# Patient Record
Sex: Female | Born: 1975 | Race: White | Hispanic: No | Marital: Married | Smoking: Never smoker
Health system: Southern US, Community
[De-identification: ages and names within clinical notes are randomized; demographics above are authoritative.]

## PROBLEM LIST (undated history)

## (undated) DIAGNOSIS — F419 Anxiety disorder, unspecified: Secondary | ICD-10-CM

## (undated) HISTORY — PX: NO PAST SURGERIES: SHX2092

---

## 2016-02-09 LAB — HM HEPATITIS C SCREENING LAB: HM Hepatitis Screen: NEGATIVE

## 2019-12-03 ENCOUNTER — Other Ambulatory Visit: Payer: Self-pay

## 2019-12-03 ENCOUNTER — Other Ambulatory Visit: Payer: Self-pay | Admitting: *Deleted

## 2019-12-03 DIAGNOSIS — Z20822 Contact with and (suspected) exposure to covid-19: Secondary | ICD-10-CM

## 2019-12-04 LAB — NOVEL CORONAVIRUS, NAA: SARS-CoV-2, NAA: NOT DETECTED

## 2020-01-11 ENCOUNTER — Encounter: Payer: Self-pay | Admitting: Emergency Medicine

## 2020-01-11 ENCOUNTER — Ambulatory Visit
Admission: EM | Admit: 2020-01-11 | Discharge: 2020-01-11 | Disposition: A | Discharge status: Home / Self Care | Payer: BC Managed Care – PPO

## 2020-01-11 ENCOUNTER — Other Ambulatory Visit: Payer: Self-pay

## 2020-01-11 DIAGNOSIS — N764 Abscess of vulva: Secondary | ICD-10-CM | POA: Diagnosis not present

## 2020-01-11 HISTORY — DX: Anxiety disorder, unspecified: F41.9

## 2020-01-11 MED ORDER — SULFAMETHOXAZOLE-TRIMETHOPRIM 800-160 MG PO TABS: 1.0000 | ORAL_TABLET | Freq: Two times a day (BID) | ORAL | 0 refills | Status: AC

## 2020-01-11 NOTE — ED Triage Notes (Signed)
Patient in today c/o a vaginal cyst x 3 days. Patient states this has been a chronic thing for her and has seen GYN who diagnosed it as a clitoral hood abscess and has been given antibiotics in the past. Patient has appointment with GYN 02/08/20. Patient states this has been going on 6-7 years off & on.

## 2020-01-11 NOTE — Discharge Instructions (Signed)
It was very nice seeing you today in clinic. Thank you for entrusting me with your care.   Keep area clean and dry. Apply warm compresses to help promote drainage. Follow up with GYN as already scheduled for ongoing treatment. Take antibiotics as prescribed. Call clinic if worsening.   Make arrangements to follow up with your regular doctor in 1 week for re-evaluation if not improving. If your symptoms/condition worsens, please seek follow up care either here or in the ER. Please remember, our [facility] providers are "right here with you" when you need [place].   Again, it was my pleasure to take care of you today. Thank you for choosing our clinic. I hope that you start to feel better quickly.   Quentin Mulling, MSN, APRN, FNP-C, CEN Advanced Practice Provider [facility]

## 2020-01-11 NOTE — ED Provider Notes (Signed)
[place], [place]   Name: Tanicia Wolaver DOB: 1976-03-16 MRN: [id number] CSN: [id number] PCP: Patient, No Pcp Per  Arrival date and time:  01/11/20 1435  Chief Complaint:  vaginal abscess   NOTE: Prior to seeing the patient today, I have reviewed the triage nursing documentation and vital signs. Clinical staff has updated patient's PMH/PSHx, current medication list, and drug allergies/intolerances to ensure comprehensive history available to assist in medical decision making.   History:   HPI: Norvell Ureste is a 44 y.o. female who presents today with complaints of a recurrent vaginal cyst/abscess. 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 Patient has been dealing with recurrent flares for approximately 6 years, during which she has experienced 5 or 6 recurrent episodes. Patient notes that for about the first 3 years, she was treated in [place]. Patient has been advised that her condition is rare. Area initially declared 6 years ago following the birth of her first child. Current flare has been worsening over the course of the last 3 days. Area described as being tender and the size of the end of her pinky finger. She denies associated drainage or fevers. She is scheduled to see her GYN on 02/08/2020.  Past Medical History:  Diagnosis Date  . Anxiety     Past Surgical History:  Procedure Laterality Date  . NO PAST SURGERIES      Family History  Problem Relation Age of Onset  . Fibromyalgia Mother   . Breast cancer Mother   . Diabetes Father   . Hyperlipidemia Father     Social History   Tobacco Use  . Smoking status: Never Smoker  . Smokeless tobacco: Never Used  Substance Use Topics  . Alcohol use: Yes    Comment: social  . Drug use: Never    There are no problems to display for this patient.   Home Medications:    Current Meds  Medication Sig  . escitalopram (LEXAPRO) 10 MG tablet Take 10 mg by  mouth daily.    Allergies:   Patient has no known allergies.  Review of Systems (ROS): Review of Systems  Constitutional: Negative for chills and fever.  Respiratory: Negative for cough and shortness of breath.   Cardiovascular: Negative for chest pain and palpitations.  Genitourinary: Negative for dysuria, hematuria, vaginal bleeding and vaginal discharge.       Vaginal abscess  Musculoskeletal: Negative for back pain.  Skin: Negative for pallor and rash.  All other systems reviewed and are negative.    Vital Signs: Today's Vitals   01/11/20 1454 01/11/20 1455 01/11/20 1534  BP:  122/81   Pulse:  73   Resp:  16   Temp:  98.7 F (37.1 C)   TempSrc:  Oral   SpO2:  100%   Weight:  140 lb (63.5 kg)   Height:  5\' 8"  (1.727 m)   PainSc: 4   4     Physical Exam: Physical Exam  Constitutional: She is oriented to person, place, and time and well-developed, well-nourished, and in no distress.  HENT:  Head: Normocephalic and atraumatic.  Eyes: Pupils are equal, round, and reactive to light.  Cardiovascular: Normal rate.  Pulmonary/Chest: Effort normal. No respiratory distress.  Genitourinary:    Genitourinary Comments: Exam deferred. Patient with known recurrence of chronic issue. No drainage or bleeding per report. Patient diagnosed with clitoral hood abscess by GYN. GYN unable to see here  until February.    Neurological: She is alert and oriented to person, place, and time. Gait normal.  Skin: Skin is warm and dry. No rash noted. She is not diaphoretic.  Psychiatric: Mood, memory, affect and judgment normal.  Nursing note and vitals reviewed.   Urgent Care Treatments / Results:   No orders of the defined types were placed in this encounter.   LABS: PLEASE NOTE: all labs that were ordered this encounter are listed, however only abnormal results are displayed. Labs Reviewed - No data to display  EKG: -None  RADIOLOGY: No results  found.  PROCEDURES: Procedures  MEDICATIONS RECEIVED THIS VISIT: Medications - No data to display  PERTINENT CLINICAL COURSE NOTES/UPDATES:   Initial Impression / Assessment and Plan / Urgent Care Course:  Pertinent labs & imaging results that were available during my care of the patient were personally reviewed by me and considered in my medical decision making (see lab/imaging section of note for values and interpretations).  Ennis Delpozo is a 44 y.o. female who presents to [facility] today with complaints of vaginal abscess  Patient is well appearing overall in clinic today. She does not appear to be in any acute distress. Presenting symptoms (see HPI) and exam as documented above. 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 Patient has not experienced any bleeding, drainage, or fevers. Followed by GYN, however unable to get in until 02/08/2020. Will empirically treated with a 5 day course of SMZ-TMP DS. Patient encouraged to apply warm compresses to help with pain and inflammation. May use Tylenol and/or Ibuprofen as needed for discomfort.  Discussed follow up with GYN on 02/08/2020 for re-evaluation as already scheduled. She was encouraged to return a call to the clinic if not improving, if pain increases, or she develops a fever. I have reviewed the follow up and strict return precautions for any new or worsening symptoms. Patient is aware of symptoms that would be deemed urgent/emergent, and would thus require further evaluation either here or in the emergency department. At the time of discharge, she verbalized understanding and consent with the discharge plan as it was reviewed with her. All questions were fielded by provider and/or clinic staff prior to patient discharge.    Final Clinical Impressions / Urgent Care Diagnoses:   Final diagnoses:  Abscess of vulva    New Prescriptions:  [place] Controlled Substance Registry consulted? Not  Applicable  Meds ordered this encounter  Medications  . sulfamethoxazole-trimethoprim (BACTRIM DS) 800-160 MG tablet    Sig: Take 1 tablet by mouth 2 (two) times daily for 5 days.    Dispense:  10 tablet    Refill:  0    Recommended Follow up Care:  Patient encouraged to follow up with the following provider within the specified time frame, or sooner as dictated by the severity of her symptoms. As always, she was instructed that for any urgent/emergent care needs, she should seek care either here or in the emergency department for more immediate evaluation.  Follow-up Information    Your GYN.   Why: As already scheduled.        NOTE: This note was prepared using Scientist, clinical (histocompatibility and immunogenetics) along with smaller Lobbyist. Despite my best ability to proofread, there is the potential that transcriptional errors may still occur from this process, and are completely unintentional.    Verlee Monte, NP 01/12/20 1506

## 2020-02-08 ENCOUNTER — Other Ambulatory Visit: Payer: Self-pay | Admitting: Obstetrics and Gynecology

## 2020-02-08 DIAGNOSIS — N644 Mastodynia: Secondary | ICD-10-CM

## 2020-03-23 ENCOUNTER — Ambulatory Visit
Admission: RE | Admit: 2020-03-23 | Discharge: 2020-03-23 | Disposition: A | Discharge status: Home / Self Care | Payer: BC Managed Care – PPO | Source: Ambulatory Visit | Attending: Obstetrics and Gynecology | Admitting: Obstetrics and Gynecology

## 2020-03-23 ENCOUNTER — Other Ambulatory Visit: Payer: Self-pay | Admitting: Obstetrics and Gynecology

## 2020-03-23 ENCOUNTER — Other Ambulatory Visit: Payer: Self-pay

## 2020-03-23 DIAGNOSIS — N644 Mastodynia: Secondary | ICD-10-CM | POA: Diagnosis not present

## 2020-03-23 DIAGNOSIS — R928 Other abnormal and inconclusive findings on diagnostic imaging of breast: Secondary | ICD-10-CM

## 2020-06-12 ENCOUNTER — Other Ambulatory Visit: Payer: Self-pay | Admitting: Obstetrics and Gynecology

## 2020-06-12 DIAGNOSIS — R928 Other abnormal and inconclusive findings on diagnostic imaging of breast: Secondary | ICD-10-CM

## 2020-07-03 ENCOUNTER — Other Ambulatory Visit: Payer: Self-pay | Admitting: Obstetrics and Gynecology

## 2020-07-03 DIAGNOSIS — R928 Other abnormal and inconclusive findings on diagnostic imaging of breast: Secondary | ICD-10-CM

## 2020-07-18 ENCOUNTER — Ambulatory Visit
Admission: RE | Admit: 2020-07-18 | Discharge: 2020-07-18 | Disposition: A | Discharge status: Home / Self Care | Payer: BC Managed Care – PPO | Source: Ambulatory Visit | Attending: Obstetrics and Gynecology | Admitting: Obstetrics and Gynecology

## 2020-07-18 DIAGNOSIS — R928 Other abnormal and inconclusive findings on diagnostic imaging of breast: Secondary | ICD-10-CM | POA: Diagnosis present

## 2020-09-04 ENCOUNTER — Other Ambulatory Visit: Payer: Self-pay

## 2020-09-04 ENCOUNTER — Other Ambulatory Visit: Payer: BC Managed Care – PPO

## 2020-09-04 DIAGNOSIS — Z20822 Contact with and (suspected) exposure to covid-19: Secondary | ICD-10-CM

## 2020-09-05 LAB — SARS-COV-2, NAA 2 DAY TAT

## 2020-09-05 LAB — NOVEL CORONAVIRUS, NAA: SARS-CoV-2, NAA: NOT DETECTED

## 2020-09-18 ENCOUNTER — Other Ambulatory Visit: Payer: Self-pay | Admitting: Obstetrics and Gynecology

## 2020-09-18 DIAGNOSIS — R928 Other abnormal and inconclusive findings on diagnostic imaging of breast: Secondary | ICD-10-CM

## 2020-10-26 ENCOUNTER — Ambulatory Visit
Admission: RE | Admit: 2020-10-26 | Discharge: 2020-10-26 | Disposition: A | Discharge status: Home / Self Care | Payer: BC Managed Care – PPO | Source: Ambulatory Visit | Attending: Obstetrics and Gynecology | Admitting: Obstetrics and Gynecology

## 2020-10-26 ENCOUNTER — Other Ambulatory Visit: Payer: Self-pay

## 2020-10-26 DIAGNOSIS — R928 Other abnormal and inconclusive findings on diagnostic imaging of breast: Secondary | ICD-10-CM

## 2021-03-06 ENCOUNTER — Other Ambulatory Visit: Payer: Self-pay | Admitting: Obstetrics and Gynecology

## 2021-03-06 DIAGNOSIS — Z1231 Encounter for screening mammogram for malignant neoplasm of breast: Secondary | ICD-10-CM

## 2021-06-22 ENCOUNTER — Ambulatory Visit: Payer: BC Managed Care – PPO

## 2021-07-17 ENCOUNTER — Telehealth (INDEPENDENT_AMBULATORY_CARE_PROVIDER_SITE_OTHER): Payer: Self-pay | Admitting: Gastroenterology

## 2021-07-17 DIAGNOSIS — Z1211 Encounter for screening for malignant neoplasm of colon: Secondary | ICD-10-CM

## 2021-07-17 MED ORDER — PEG 3350-KCL-NA BICARB-NACL 420 G PO SOLR: 4000.0000 mL | Freq: Once | ORAL | 0 refills | Status: AC

## 2021-07-17 NOTE — Progress Notes (Signed)
Gastroenterology Pre-Procedure Review  Request Date: 09/10/21 Requesting Physician: Dr. Allegra Lai  PATIENT REVIEW QUESTIONS: The patient responded to the following health history questions as indicated:    1. Are you having any GI issues? no 2. Do you have a personal history of Polyps? no 3. Do you have a family history of Colon Cancer or Polyps? yes (M. Grandfather colon cancer; Mother colon polyps.) 4. Diabetes Mellitus? no 5. Joint replacements in the past 12 months?no 6. Major health problems in the past 3 months?no 7. Any artificial heart valves, MVP, or defibrillator?no    MEDICATIONS & ALLERGIES:    Patient reports the following regarding taking any anticoagulation/antiplatelet therapy:   Plavix, Coumadin, Eliquis, Xarelto, Lovenox, Pradaxa, Brilinta, or Effient? no Aspirin? no  Patient confirms/reports the following medications:  Current Outpatient Medications  Medication Sig Dispense Refill   escitalopram (LEXAPRO) 10 MG tablet Take 10 mg by mouth daily.     No current facility-administered medications for this visit.    Patient confirms/reports the following allergies:  No Known Allergies  No orders of the defined types were placed in this encounter.   AUTHORIZATION INFORMATION Primary Insurance: 1D#: Group #:  Secondary Insurance: 1D#: Group #:  SCHEDULE INFORMATION: Date: 09/10/21 Time: Location: [facility]

## 2021-09-06 ENCOUNTER — Telehealth: Payer: Self-pay

## 2021-09-06 NOTE — Telephone Encounter (Signed)
Pt. Having procedure on Monday. She said she doe snot know her next step. Per pt. She is waiting on a call about instructions

## 2021-09-07 ENCOUNTER — Telehealth: Payer: Self-pay

## 2021-09-07 NOTE — Telephone Encounter (Signed)
Returned patients call. Informed her instructions were sent to my chart and mailed out to home. Prep was sent to pharmacy back in July. Called to check with pharmacy and he stated the prep had been there for 14 days and no pick up. He will refill the order. Pt has been notified.

## 2021-09-07 NOTE — Telephone Encounter (Signed)
Pt. Calling she is having a procedure on Monday... she has not received prep

## 2021-09-10 ENCOUNTER — Ambulatory Visit: Payer: BC Managed Care – PPO | Admitting: Registered Nurse

## 2021-09-10 ENCOUNTER — Encounter
Admission: RE | Disposition: A | Discharge status: Home / Self Care | Payer: Self-pay | Source: Home / Self Care | Attending: Gastroenterology

## 2021-09-10 ENCOUNTER — Ambulatory Visit
Admission: RE | Admit: 2021-09-10 | Discharge: 2021-09-10 | Disposition: A | Discharge status: Home / Self Care | Payer: BC Managed Care – PPO | Attending: Gastroenterology | Admitting: Gastroenterology

## 2021-09-10 DIAGNOSIS — Z79899 Other long term (current) drug therapy: Secondary | ICD-10-CM | POA: Diagnosis not present

## 2021-09-10 DIAGNOSIS — K635 Polyp of colon: Secondary | ICD-10-CM

## 2021-09-10 DIAGNOSIS — Z1211 Encounter for screening for malignant neoplasm of colon: Secondary | ICD-10-CM | POA: Diagnosis not present

## 2021-09-10 DIAGNOSIS — Z8371 Family history of colonic polyps: Secondary | ICD-10-CM | POA: Diagnosis present

## 2021-09-10 HISTORY — PX: COLONOSCOPY WITH PROPOFOL: SHX5780

## 2021-09-10 LAB — POCT PREGNANCY, URINE: Preg Test, Ur: NEGATIVE

## 2021-09-10 SURGERY — COLONOSCOPY WITH PROPOFOL
Anesthesia: General

## 2021-09-10 MED ORDER — LIDOCAINE HCL (CARDIAC) PF 100 MG/5ML IV SOSY
PREFILLED_SYRINGE | INTRAVENOUS | Status: DC | PRN
  Administered 2021-09-10: 80 mg via INTRAVENOUS

## 2021-09-10 MED ORDER — PROPOFOL 10 MG/ML IV BOLUS
INTRAVENOUS | Status: DC | PRN
  Administered 2021-09-10: 70 mg via INTRAVENOUS
  Administered 2021-09-10: 10 mg via INTRAVENOUS

## 2021-09-10 MED ORDER — PROPOFOL 500 MG/50ML IV EMUL
INTRAVENOUS | Status: DC | PRN
  Administered 2021-09-10: 150 ug/kg/min via INTRAVENOUS

## 2021-09-10 MED ORDER — SODIUM CHLORIDE 0.9 % IV SOLN
INTRAVENOUS | Status: DC
  Administered 2021-09-10: 20 mL/h via INTRAVENOUS

## 2021-09-10 NOTE — Anesthesia Preprocedure Evaluation (Signed)
Anesthesia Evaluation  Patient identified by MRN, date of birth, ID band Patient awake    Reviewed: Allergy & Precautions, H&P , NPO status , Patient's Chart, lab work & pertinent test results, reviewed documented beta blocker date and time   Airway Mallampati: II   Neck ROM: full    Dental  (+) Teeth Intact   Pulmonary neg pulmonary ROS,    Pulmonary exam normal        Cardiovascular negative cardio ROS Normal cardiovascular exam Rhythm:regular Rate:Normal     Neuro/Psych negative neurological ROS  negative psych ROS   GI/Hepatic negative GI ROS, Neg liver ROS,   Endo/Other  negative endocrine ROS  Renal/GU negative Renal ROS  negative genitourinary   Musculoskeletal   Abdominal   Peds  Hematology negative hematology ROS (+)   Anesthesia Other Findings Past Medical History: No date: Anxiety Past Surgical History: No date: NO PAST SURGERIES BMI    Body Mass Index: 21.29 kg/m     Reproductive/Obstetrics negative OB ROS                             Anesthesia Physical Anesthesia Plan  ASA: 2  Anesthesia Plan: General   Post-op Pain Management:    Induction:   PONV Risk Score and Plan:   Airway Management Planned:   Additional Equipment:   Intra-op Plan:   Post-operative Plan:   Informed Consent: I have reviewed the patients History and Physical, chart, labs and discussed the procedure including the risks, benefits and alternatives for the proposed anesthesia with the patient or authorized representative who has indicated his/her understanding and acceptance.     Dental Advisory Given  Plan Discussed with: CRNA  Anesthesia Plan Comments:         Anesthesia Quick Evaluation

## 2021-09-10 NOTE — Op Note (Signed)
[facility] Gastroenterology Patient Name: Cheryl Sanford Procedure Date: 09/10/2021 8:03 AM MRN: [id number] Account #: [id number] Date of Birth: 1976-02-23 Admit Type: Outpatient Age: 45 Room: [facility] ENDO ROOM 1 Gender: Female Note Status: Finalized Instrument Name: Prentice Docker [id number] Procedure:             Colonoscopy Indications:           Colon cancer screening in patient at increased risk:                         Family history of 1st-degree relative with colon                         polyps, Colon cancer screening in patient at increased                         risk: Family history of colon polyps in multiple                         1st-degree relatives, This is the patient's first                         colonoscopy Providers:             Toney Reil MD, MD Referring MD:          Ian Bushman. Constance Goltz (Referring MD) Medicines:             General Anesthesia Complications:         No immediate complications. Estimated blood loss: None. Procedure:             Pre-Anesthesia Assessment:                        - Prior to the procedure, a History and Physical was                         performed, and patient medications and allergies were                         reviewed. The patient is competent. The risks and                         benefits of the procedure and the sedation options and                         risks were discussed with the patient. All questions                         were answered and informed consent was obtained.                         Patient identification and proposed procedure were                         verified by the physician, the nurse, the                         anesthesiologist, the anesthetist and the technician  in the pre-procedure area in the procedure room in the                         endoscopy suite. Mental Status Examination: alert and                         oriented. Airway Examination:  normal oropharyngeal                         airway and neck mobility. Respiratory Examination:                         clear to auscultation. CV Examination: normal.                         Prophylactic Antibiotics: The patient does not require                         prophylactic antibiotics. Prior Anticoagulants: The                         patient has taken no previous anticoagulant or                         antiplatelet agents. ASA Grade Assessment: II - A                         patient with mild systemic disease. After reviewing                         the risks and benefits, the patient was deemed in                         satisfactory condition to undergo the procedure. The                         anesthesia plan was to use general anesthesia.                         Immediately prior to administration of medications,                         the patient was re-assessed for adequacy to receive                         sedatives. The heart rate, respiratory rate, oxygen                         saturations, blood pressure, adequacy of pulmonary                         ventilation, and response to care were monitored                         throughout the procedure. The physical status of the                         patient was re-assessed after the procedure.  After obtaining informed consent, the colonoscope was                         passed under direct vision. Throughout the procedure,                         the patient's blood pressure, pulse, and oxygen                         saturations were monitored continuously. The                         Colonoscope was introduced through the anus and                         advanced to the the terminal ileum, with                         identification of the appendiceal orifice and IC                         valve. The colonoscopy was performed without                         difficulty. The patient tolerated the  procedure well.                         The quality of the bowel preparation was evaluated                         using the BBPS [facility] Bowel Preparation Scale) with                         scores of: Right Colon = 3, Transverse Colon = 3 and                         Left Colon = 3 (entire mucosa seen well with no                         residual staining, small fragments of stool or opaque                         liquid). The total BBPS score equals 9. Findings:      The perianal and digital rectal examinations were normal. Pertinent       negatives include normal sphincter tone and no palpable rectal lesions.      The terminal ileum appeared normal.      A 3 mm polyp was found in the cecum. The polyp was sessile. The polyp       was removed with a cold snare. Resection and retrieval were complete.      The retroflexed view of the distal rectum and anal verge was normal and       showed no anal or rectal abnormalities.      The exam was otherwise without abnormality. Impression:            - The examined portion of the ileum was normal.                        -  One 3 mm polyp in the cecum, removed with a cold                         snare. Resected and retrieved.                        - The distal rectum and anal verge are normal on                         retroflexion view.                        - The examination was otherwise normal. Recommendation:        - Discharge patient to home (with escort).                        - Resume previous diet today.                        - Continue present medications.                        - Await pathology results.                        - Repeat colonoscopy in 7-10 years for surveillance                         based on pathology results. Procedure Code(s):     --- Professional ---                        [phone], Colonoscopy, flexible; with removal of                         tumor(s), polyp(s), or other lesion(s) by snare                          technique Diagnosis Code(s):     --- Professional ---                        K63.5, Polyp of colon                        Z83.71, Family history of colonic polyps CPT copyright 2019 American Medical Association. All rights reserved. The codes documented in this report are preliminary and upon coder review may  be revised to meet current compliance requirements. Dr. Libby Maw Toney Reil MD, MD 09/10/2021 8:30:12 AM This report has been signed electronically. Number of Addenda: 0 Note Initiated On: 09/10/2021 8:03 AM Scope Withdrawal Time: 0 hours 9 minutes 12 seconds  Total Procedure Duration: 0 hours 14 minutes 6 seconds  Estimated Blood Loss:  Estimated blood loss: none.      [facility]

## 2021-09-10 NOTE — Anesthesia Postprocedure Evaluation (Signed)
Anesthesia Post Note  Patient: Cheryl Sanford  Procedure(s) Performed: COLONOSCOPY WITH PROPOFOL  Patient location during evaluation: PACU Anesthesia Type: General Level of consciousness: awake and alert Pain management: pain level controlled Vital Signs Assessment: post-procedure vital signs reviewed and stable Respiratory status: spontaneous breathing, nonlabored ventilation, respiratory function stable and patient connected to nasal cannula oxygen Cardiovascular status: blood pressure returned to baseline and stable Postop Assessment: no apparent nausea or vomiting Anesthetic complications: no   No notable events documented.   Last Vitals:  Vitals:   09/10/21 0850 09/10/21 0900  BP: 110/77 124/83  Pulse: 62 (!) 55  Resp: 18 17  Temp:    SpO2: 100% 100%    Last Pain:  Vitals:   09/10/21 0830  TempSrc: Temporal  PainSc:                  Yevette Edwards

## 2021-09-10 NOTE — H&P (Signed)
  Arlyss Repress, MD [address]  [address]  [place], [place] [address]  Main: [phone]  Fax: [phone] Pager: [phone]  Primary Care Physician:  Angie Fava, MD Primary Gastroenterologist:  Dr. Arlyss Repress  Pre-Procedure History & Physical: HPI:  Cheryl Sanford is a 45 y.o. female is here for an colonoscopy.   Past Medical History:  Diagnosis Date   Anxiety     Past Surgical History:  Procedure Laterality Date   NO PAST SURGERIES      Prior to Admission medications   Medication Sig Start Date End Date Taking? Authorizing Provider  escitalopram (LEXAPRO) 10 MG tablet Take 10 mg by mouth daily.   Yes [provider]    Allergies as of 07/17/2021   (No Known Allergies)    Family History  Problem Relation Age of Onset   Fibromyalgia Mother    Breast cancer Mother 40   Diabetes Father    Hyperlipidemia Father     Social History   Socioeconomic History   Marital status: Married    Spouse name: Not on file   Number of children: Not on file   Years of education: Not on file   Highest education level: Not on file  Occupational History   Not on file  Tobacco Use   Smoking status: Never   Smokeless tobacco: Never  Vaping Use   Vaping Use: Never used  Substance and Sexual Activity   Alcohol use: Yes    Comment: social   Drug use: Never   Sexual activity: Not on file  Other Topics Concern   Not on file  Social History Narrative   Not on file   Social Determinants of Health   Financial Resource Strain: Not on file  Food Insecurity: Not on file  Transportation Needs: Not on file  Physical Activity: Not on file  Stress: Not on file  Social Connections: Not on file  Intimate Partner Violence: Not on file    Review of Systems: See HPI, otherwise negative ROS  Physical Exam: BP 125/68   Pulse 69   Temp (!) 96.8 F (36 C) (Temporal)   Resp 20   Ht 5\' 8"  (1.727 m)   Wt 63.5 kg   SpO2 100%   BMI 21.29 kg/m  General:    Alert,  pleasant and cooperative in NAD Head:  Normocephalic and atraumatic. Neck:  Supple; no masses or thyromegaly. Lungs:  Clear throughout to auscultation.    Heart:  Regular rate and rhythm. Abdomen:  Soft, nontender and nondistended. Normal bowel sounds, without guarding, and without rebound.   Neurologic:  Alert and  oriented x4;  grossly normal neurologically.  Impression/Plan: Cheryl Sanford is here for an colonoscopy to be performed for colon cancer screening, family h/o colon polyps and cancer  Risks, benefits, limitations, and alternatives regarding  colonoscopy have been reviewed with the patient.  Questions have been answered.  All parties agreeable.   Roylene Reason, MD  09/10/2021, 8:02 AM

## 2021-09-10 NOTE — Transfer of Care (Signed)
Immediate Anesthesia Transfer of Care Note  Patient: Cheryl Sanford  Procedure(s) Performed: COLONOSCOPY WITH PROPOFOL  Patient Location: Endoscopy Unit  Anesthesia Type:General  Level of Consciousness: drowsy  Airway & Oxygen Therapy: Patient Spontanous Breathing  Post-op Assessment: Report given to RN and Post -op Vital signs reviewed and stable  Post vital signs: Reviewed and stable  Last Vitals:  Vitals Value Taken Time  BP    Temp    Pulse    Resp    SpO2      Last Pain:  Vitals:   09/10/21 0744  TempSrc: Temporal  PainSc: 0-No pain         Complications: No notable events documented.

## 2021-09-11 ENCOUNTER — Encounter: Payer: Self-pay | Admitting: Gastroenterology

## 2021-09-11 LAB — SURGICAL PATHOLOGY

## 2022-03-12 ENCOUNTER — Other Ambulatory Visit: Payer: Self-pay | Admitting: Obstetrics and Gynecology

## 2022-03-12 DIAGNOSIS — Z1231 Encounter for screening mammogram for malignant neoplasm of breast: Secondary | ICD-10-CM

## 2022-04-26 ENCOUNTER — Ambulatory Visit
Admission: RE | Admit: 2022-04-26 | Discharge: 2022-04-26 | Disposition: A | Discharge status: Home / Self Care | Payer: BC Managed Care – PPO | Source: Ambulatory Visit | Attending: Obstetrics and Gynecology | Admitting: Obstetrics and Gynecology

## 2022-04-26 DIAGNOSIS — Z1231 Encounter for screening mammogram for malignant neoplasm of breast: Secondary | ICD-10-CM | POA: Insufficient documentation

## 2022-08-30 DIAGNOSIS — M5416 Radiculopathy, lumbar region: Secondary | ICD-10-CM | POA: Diagnosis not present

## 2022-08-30 DIAGNOSIS — M955 Acquired deformity of pelvis: Secondary | ICD-10-CM | POA: Diagnosis not present

## 2022-08-30 DIAGNOSIS — M9903 Segmental and somatic dysfunction of lumbar region: Secondary | ICD-10-CM | POA: Diagnosis not present

## 2022-08-30 DIAGNOSIS — M9905 Segmental and somatic dysfunction of pelvic region: Secondary | ICD-10-CM | POA: Diagnosis not present

## 2022-09-27 DIAGNOSIS — M5416 Radiculopathy, lumbar region: Secondary | ICD-10-CM | POA: Diagnosis not present

## 2022-09-27 DIAGNOSIS — M955 Acquired deformity of pelvis: Secondary | ICD-10-CM | POA: Diagnosis not present

## 2022-09-27 DIAGNOSIS — M9903 Segmental and somatic dysfunction of lumbar region: Secondary | ICD-10-CM | POA: Diagnosis not present

## 2022-09-27 DIAGNOSIS — M9905 Segmental and somatic dysfunction of pelvic region: Secondary | ICD-10-CM | POA: Diagnosis not present

## 2022-10-29 DIAGNOSIS — M5416 Radiculopathy, lumbar region: Secondary | ICD-10-CM | POA: Diagnosis not present

## 2022-10-29 DIAGNOSIS — M955 Acquired deformity of pelvis: Secondary | ICD-10-CM | POA: Diagnosis not present

## 2022-10-29 DIAGNOSIS — M9905 Segmental and somatic dysfunction of pelvic region: Secondary | ICD-10-CM | POA: Diagnosis not present

## 2022-10-29 DIAGNOSIS — M9903 Segmental and somatic dysfunction of lumbar region: Secondary | ICD-10-CM | POA: Diagnosis not present

## 2022-11-25 DIAGNOSIS — A049 Bacterial intestinal infection, unspecified: Secondary | ICD-10-CM | POA: Diagnosis not present

## 2022-11-25 DIAGNOSIS — R110 Nausea: Secondary | ICD-10-CM | POA: Diagnosis not present

## 2022-11-25 DIAGNOSIS — R197 Diarrhea, unspecified: Secondary | ICD-10-CM | POA: Diagnosis not present

## 2022-11-25 DIAGNOSIS — Z3202 Encounter for pregnancy test, result negative: Secondary | ICD-10-CM | POA: Diagnosis not present

## 2022-11-29 DIAGNOSIS — M5416 Radiculopathy, lumbar region: Secondary | ICD-10-CM | POA: Diagnosis not present

## 2022-11-29 DIAGNOSIS — M955 Acquired deformity of pelvis: Secondary | ICD-10-CM | POA: Diagnosis not present

## 2022-11-29 DIAGNOSIS — M9905 Segmental and somatic dysfunction of pelvic region: Secondary | ICD-10-CM | POA: Diagnosis not present

## 2022-11-29 DIAGNOSIS — M9903 Segmental and somatic dysfunction of lumbar region: Secondary | ICD-10-CM | POA: Diagnosis not present

## 2022-12-27 DIAGNOSIS — M9903 Segmental and somatic dysfunction of lumbar region: Secondary | ICD-10-CM | POA: Diagnosis not present

## 2022-12-27 DIAGNOSIS — M9905 Segmental and somatic dysfunction of pelvic region: Secondary | ICD-10-CM | POA: Diagnosis not present

## 2022-12-27 DIAGNOSIS — M955 Acquired deformity of pelvis: Secondary | ICD-10-CM | POA: Diagnosis not present

## 2022-12-27 DIAGNOSIS — M5416 Radiculopathy, lumbar region: Secondary | ICD-10-CM | POA: Diagnosis not present

## 2023-01-24 DIAGNOSIS — M9905 Segmental and somatic dysfunction of pelvic region: Secondary | ICD-10-CM | POA: Diagnosis not present

## 2023-01-24 DIAGNOSIS — M5416 Radiculopathy, lumbar region: Secondary | ICD-10-CM | POA: Diagnosis not present

## 2023-01-24 DIAGNOSIS — M955 Acquired deformity of pelvis: Secondary | ICD-10-CM | POA: Diagnosis not present

## 2023-01-24 DIAGNOSIS — M9903 Segmental and somatic dysfunction of lumbar region: Secondary | ICD-10-CM | POA: Diagnosis not present

## 2023-01-27 ENCOUNTER — Other Ambulatory Visit: Payer: Self-pay | Admitting: Ophthalmology

## 2023-01-27 DIAGNOSIS — H4710 Unspecified papilledema: Secondary | ICD-10-CM

## 2023-02-12 ENCOUNTER — Ambulatory Visit
Admission: RE | Admit: 2023-02-12 | Discharge: 2023-02-12 | Disposition: A | Discharge status: Home / Self Care | Payer: BC Managed Care – PPO | Source: Ambulatory Visit | Attending: Ophthalmology | Admitting: Ophthalmology

## 2023-02-12 DIAGNOSIS — H4710 Unspecified papilledema: Secondary | ICD-10-CM | POA: Diagnosis not present

## 2023-02-12 DIAGNOSIS — R600 Localized edema: Secondary | ICD-10-CM | POA: Diagnosis not present

## 2023-02-12 DIAGNOSIS — R200 Anesthesia of skin: Secondary | ICD-10-CM | POA: Diagnosis not present

## 2023-02-12 MED ORDER — IOHEXOL 300 MG/ML  SOLN
75.0000 mL | Freq: Once | INTRAMUSCULAR | Status: AC | PRN
  Administered 2023-02-12: 75 mL via INTRAVENOUS

## 2023-02-21 DIAGNOSIS — M5416 Radiculopathy, lumbar region: Secondary | ICD-10-CM | POA: Diagnosis not present

## 2023-02-21 DIAGNOSIS — M955 Acquired deformity of pelvis: Secondary | ICD-10-CM | POA: Diagnosis not present

## 2023-02-21 DIAGNOSIS — M9905 Segmental and somatic dysfunction of pelvic region: Secondary | ICD-10-CM | POA: Diagnosis not present

## 2023-02-21 DIAGNOSIS — M9903 Segmental and somatic dysfunction of lumbar region: Secondary | ICD-10-CM | POA: Diagnosis not present

## 2023-03-12 DIAGNOSIS — Z01419 Encounter for gynecological examination (general) (routine) without abnormal findings: Secondary | ICD-10-CM | POA: Diagnosis not present

## 2023-03-12 DIAGNOSIS — Z6822 Body mass index (BMI) 22.0-22.9, adult: Secondary | ICD-10-CM | POA: Diagnosis not present

## 2023-03-19 DIAGNOSIS — M955 Acquired deformity of pelvis: Secondary | ICD-10-CM | POA: Diagnosis not present

## 2023-03-19 DIAGNOSIS — M9905 Segmental and somatic dysfunction of pelvic region: Secondary | ICD-10-CM | POA: Diagnosis not present

## 2023-03-19 DIAGNOSIS — M5416 Radiculopathy, lumbar region: Secondary | ICD-10-CM | POA: Diagnosis not present

## 2023-03-19 DIAGNOSIS — M9903 Segmental and somatic dysfunction of lumbar region: Secondary | ICD-10-CM | POA: Diagnosis not present

## 2023-04-11 DIAGNOSIS — M5416 Radiculopathy, lumbar region: Secondary | ICD-10-CM | POA: Diagnosis not present

## 2023-04-11 DIAGNOSIS — M9903 Segmental and somatic dysfunction of lumbar region: Secondary | ICD-10-CM | POA: Diagnosis not present

## 2023-04-11 DIAGNOSIS — M955 Acquired deformity of pelvis: Secondary | ICD-10-CM | POA: Diagnosis not present

## 2023-04-11 DIAGNOSIS — M9905 Segmental and somatic dysfunction of pelvic region: Secondary | ICD-10-CM | POA: Diagnosis not present

## 2023-05-02 DIAGNOSIS — M9905 Segmental and somatic dysfunction of pelvic region: Secondary | ICD-10-CM | POA: Diagnosis not present

## 2023-05-02 DIAGNOSIS — M9903 Segmental and somatic dysfunction of lumbar region: Secondary | ICD-10-CM | POA: Diagnosis not present

## 2023-05-02 DIAGNOSIS — M5416 Radiculopathy, lumbar region: Secondary | ICD-10-CM | POA: Diagnosis not present

## 2023-05-02 DIAGNOSIS — M955 Acquired deformity of pelvis: Secondary | ICD-10-CM | POA: Diagnosis not present

## 2023-05-23 DIAGNOSIS — M955 Acquired deformity of pelvis: Secondary | ICD-10-CM | POA: Diagnosis not present

## 2023-05-23 DIAGNOSIS — M5416 Radiculopathy, lumbar region: Secondary | ICD-10-CM | POA: Diagnosis not present

## 2023-05-23 DIAGNOSIS — M9903 Segmental and somatic dysfunction of lumbar region: Secondary | ICD-10-CM | POA: Diagnosis not present

## 2023-05-23 DIAGNOSIS — M9905 Segmental and somatic dysfunction of pelvic region: Secondary | ICD-10-CM | POA: Diagnosis not present

## 2023-06-20 DIAGNOSIS — M5416 Radiculopathy, lumbar region: Secondary | ICD-10-CM | POA: Diagnosis not present

## 2023-06-20 DIAGNOSIS — M9903 Segmental and somatic dysfunction of lumbar region: Secondary | ICD-10-CM | POA: Diagnosis not present

## 2023-06-20 DIAGNOSIS — M955 Acquired deformity of pelvis: Secondary | ICD-10-CM | POA: Diagnosis not present

## 2023-06-20 DIAGNOSIS — M9905 Segmental and somatic dysfunction of pelvic region: Secondary | ICD-10-CM | POA: Diagnosis not present

## 2023-07-21 DIAGNOSIS — M955 Acquired deformity of pelvis: Secondary | ICD-10-CM | POA: Diagnosis not present

## 2023-07-21 DIAGNOSIS — M9903 Segmental and somatic dysfunction of lumbar region: Secondary | ICD-10-CM | POA: Diagnosis not present

## 2023-07-21 DIAGNOSIS — M5416 Radiculopathy, lumbar region: Secondary | ICD-10-CM | POA: Diagnosis not present

## 2023-07-21 DIAGNOSIS — M9905 Segmental and somatic dysfunction of pelvic region: Secondary | ICD-10-CM | POA: Diagnosis not present

## 2023-08-20 DIAGNOSIS — M5416 Radiculopathy, lumbar region: Secondary | ICD-10-CM | POA: Diagnosis not present

## 2023-08-20 DIAGNOSIS — M955 Acquired deformity of pelvis: Secondary | ICD-10-CM | POA: Diagnosis not present

## 2023-08-20 DIAGNOSIS — M9905 Segmental and somatic dysfunction of pelvic region: Secondary | ICD-10-CM | POA: Diagnosis not present

## 2023-08-20 DIAGNOSIS — M9903 Segmental and somatic dysfunction of lumbar region: Secondary | ICD-10-CM | POA: Diagnosis not present

## 2023-09-12 DIAGNOSIS — R748 Abnormal levels of other serum enzymes: Secondary | ICD-10-CM | POA: Diagnosis not present

## 2023-09-12 DIAGNOSIS — Z1231 Encounter for screening mammogram for malignant neoplasm of breast: Secondary | ICD-10-CM | POA: Diagnosis not present

## 2023-09-12 DIAGNOSIS — E7800 Pure hypercholesterolemia, unspecified: Secondary | ICD-10-CM | POA: Diagnosis not present

## 2023-09-22 DIAGNOSIS — M955 Acquired deformity of pelvis: Secondary | ICD-10-CM | POA: Diagnosis not present

## 2023-09-22 DIAGNOSIS — M9905 Segmental and somatic dysfunction of pelvic region: Secondary | ICD-10-CM | POA: Diagnosis not present

## 2023-09-22 DIAGNOSIS — M5416 Radiculopathy, lumbar region: Secondary | ICD-10-CM | POA: Diagnosis not present

## 2023-09-22 DIAGNOSIS — M9903 Segmental and somatic dysfunction of lumbar region: Secondary | ICD-10-CM | POA: Diagnosis not present

## 2023-10-21 DIAGNOSIS — M5416 Radiculopathy, lumbar region: Secondary | ICD-10-CM | POA: Diagnosis not present

## 2023-10-21 DIAGNOSIS — M9905 Segmental and somatic dysfunction of pelvic region: Secondary | ICD-10-CM | POA: Diagnosis not present

## 2023-10-21 DIAGNOSIS — M955 Acquired deformity of pelvis: Secondary | ICD-10-CM | POA: Diagnosis not present

## 2023-10-21 DIAGNOSIS — M9903 Segmental and somatic dysfunction of lumbar region: Secondary | ICD-10-CM | POA: Diagnosis not present

## 2023-11-01 IMAGING — MG MM DIGITAL SCREENING BILAT W/ TOMO AND CAD
8 series · 9 of 24 positions shown · non-contrast
Comparison: Previous exam(s).

CLINICAL DATA: Screening.

EXAM:
DIGITAL SCREENING BILATERAL MAMMOGRAM WITH TOMOSYNTHESIS AND CAD
TECHNIQUE: Bilateral screening digital craniocaudal and mediolateral oblique
mammograms were obtained. Bilateral screening digital breast
tomosynthesis was performed. The images were evaluated with
computer-aided detection.

[R CC synth-2D]
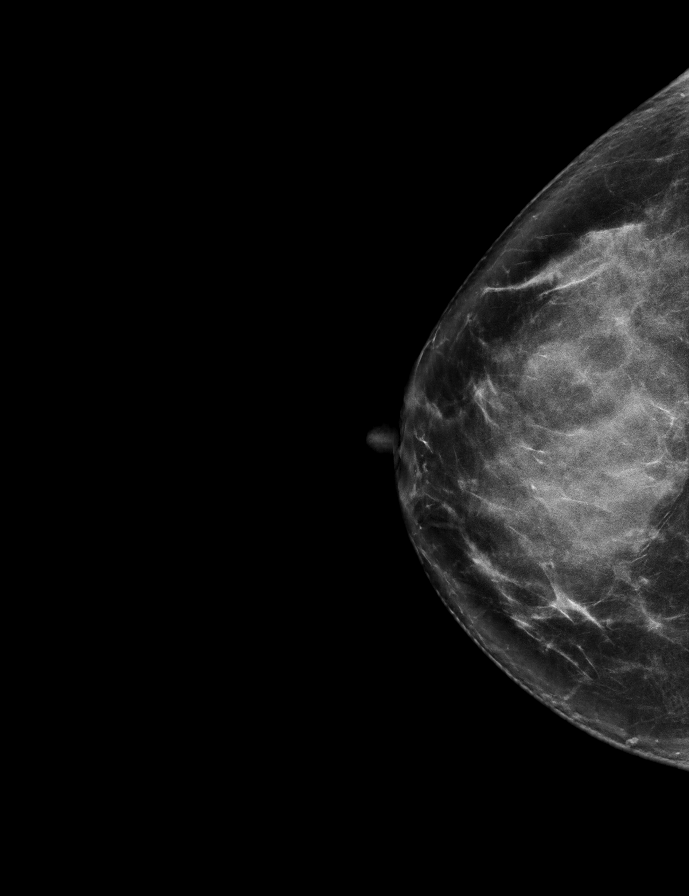

[R MLO synth-2D]
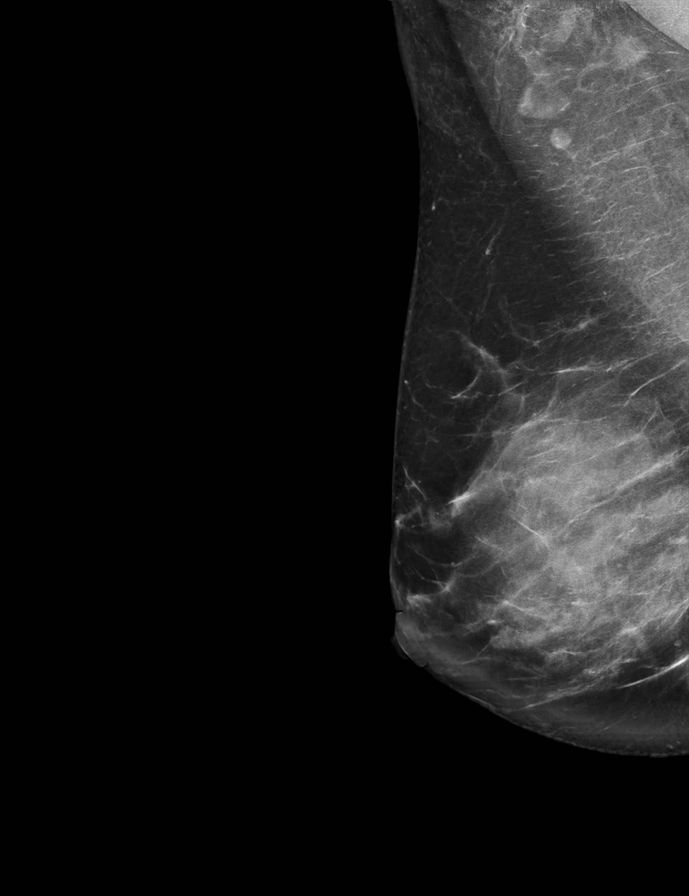

[L CC synth-2D]
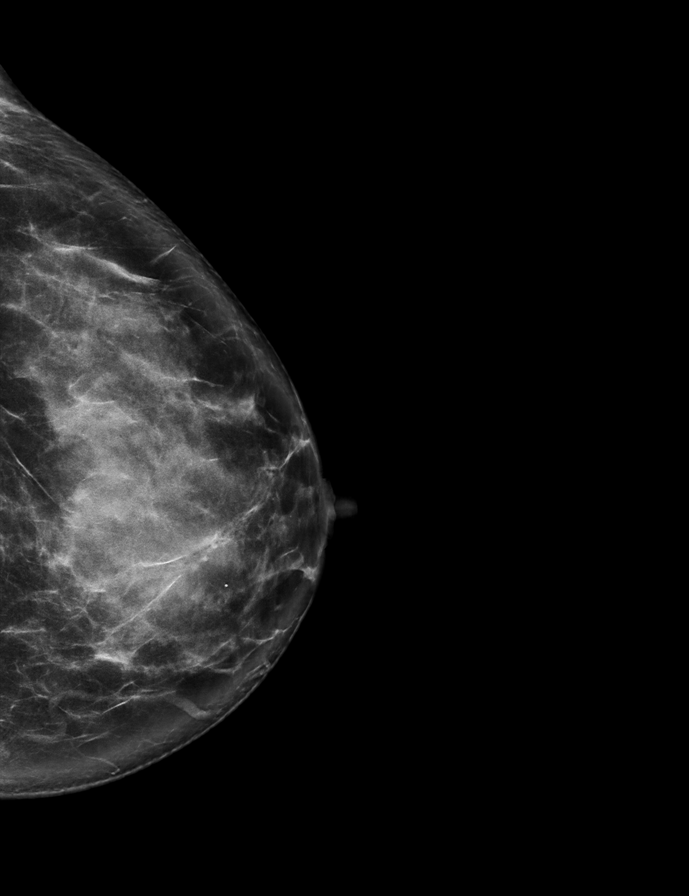

[L MLO synth-2D]
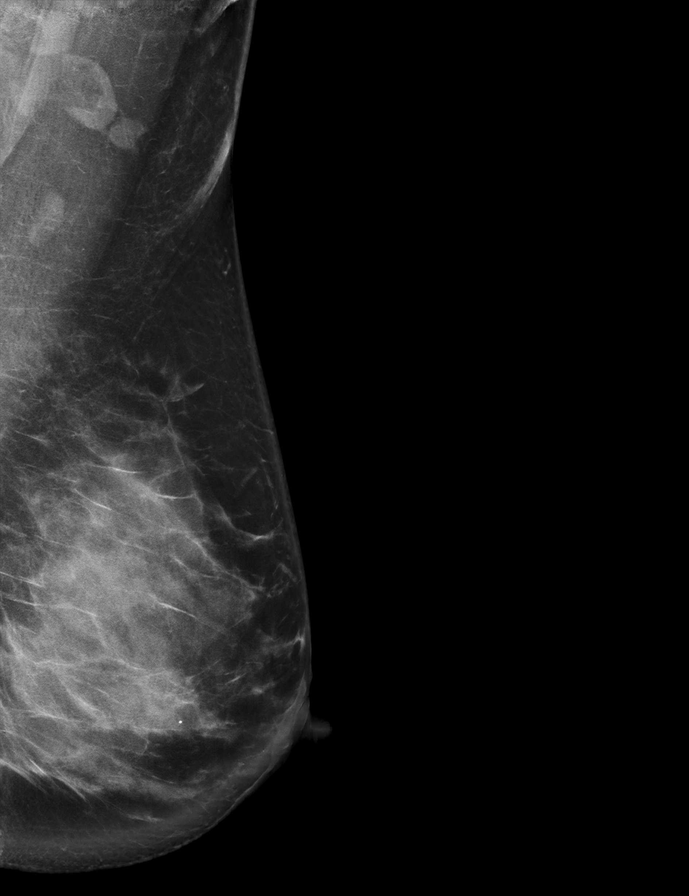

[R CC tomo · 2 of 69 frames shown]
[frame 23/69]
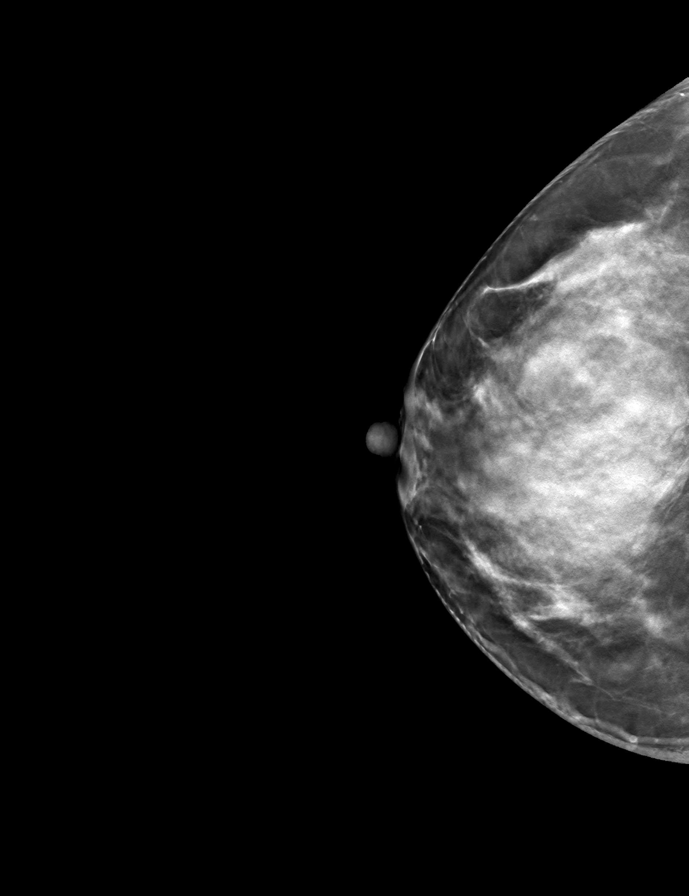
[frame 35/69]
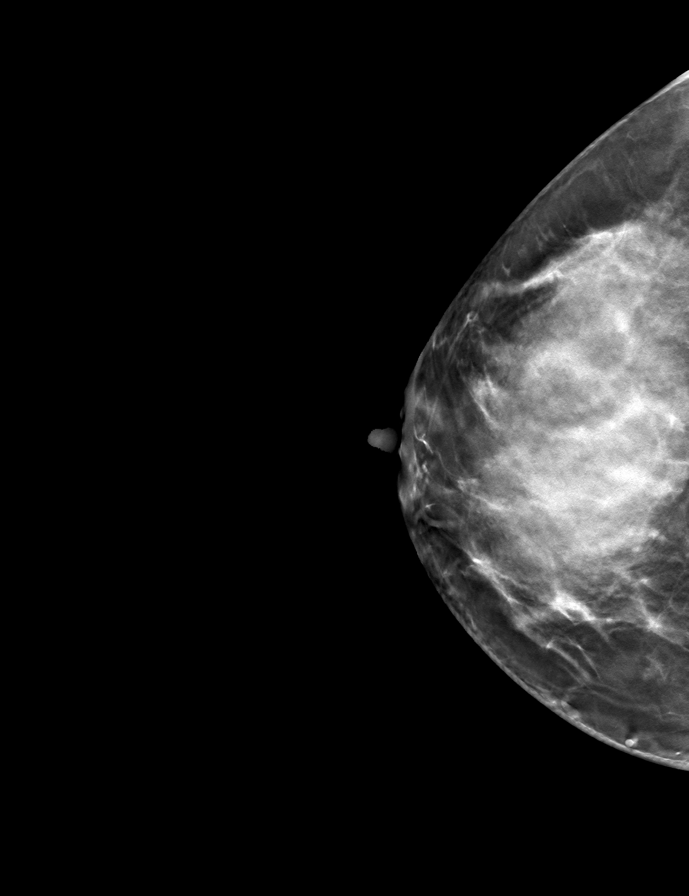

[L CC tomo · tomo slice 39/76.0]
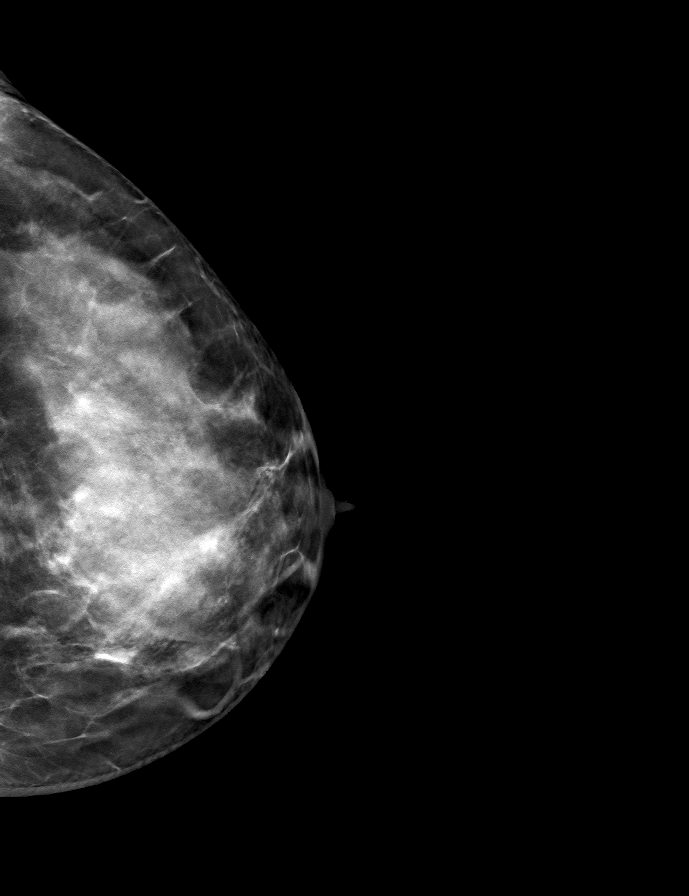

[R MLO tomo · tomo slice 41/82.0]
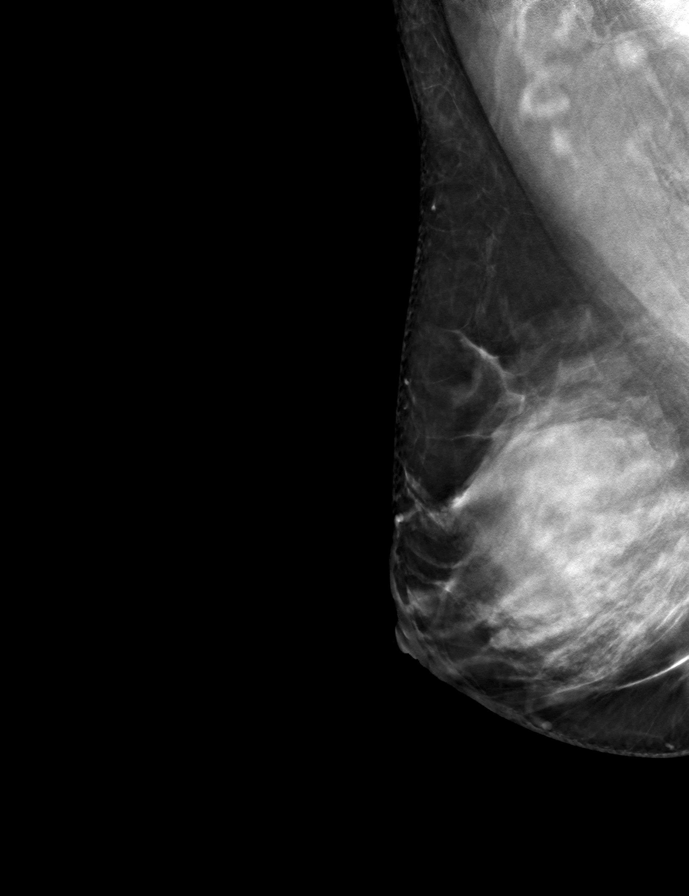

[L MLO tomo · tomo slice 45/89.0]
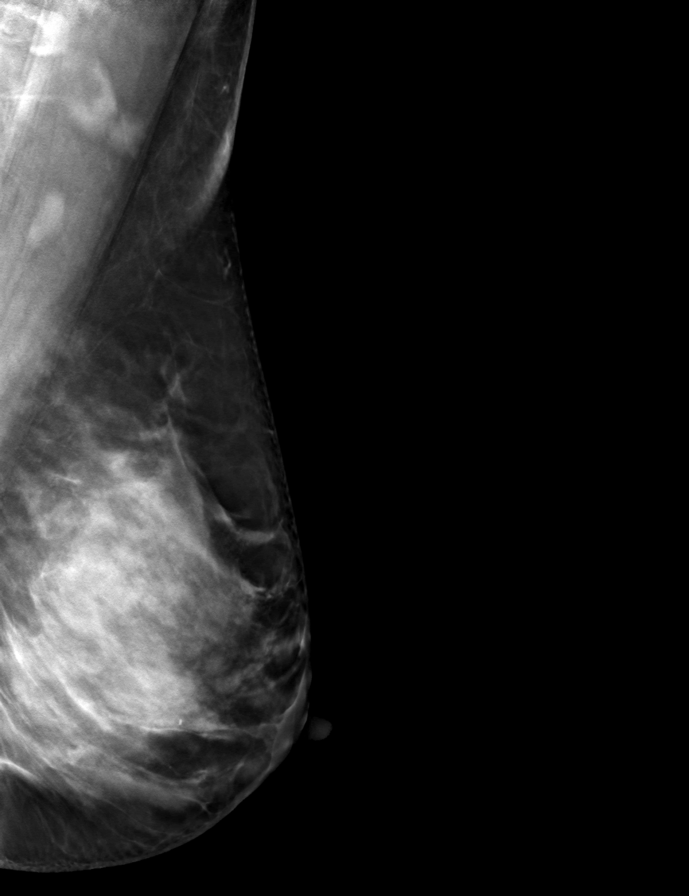

[9 of 24 positions shown; findings below may reference images not displayed]

ACR Breast Density Category d: The breast tissue is extremely dense,
which lowers the sensitivity of mammography
FINDINGS: There are no findings suspicious for malignancy. There are multiple
round and oval masses which have waxed and waned since prior exam.
These are most consistent with benign cysts.
IMPRESSION: No mammographic evidence of malignancy. A result letter of this
screening mammogram will be mailed directly to the patient.

RECOMMENDATION:
Screening mammogram in one year. (Code:U8-L-APN)

BI-RADS CATEGORY  2: Benign.

## 2023-11-17 DIAGNOSIS — M955 Acquired deformity of pelvis: Secondary | ICD-10-CM | POA: Diagnosis not present

## 2023-11-17 DIAGNOSIS — M5416 Radiculopathy, lumbar region: Secondary | ICD-10-CM | POA: Diagnosis not present

## 2023-11-17 DIAGNOSIS — M9905 Segmental and somatic dysfunction of pelvic region: Secondary | ICD-10-CM | POA: Diagnosis not present

## 2023-11-17 DIAGNOSIS — M9903 Segmental and somatic dysfunction of lumbar region: Secondary | ICD-10-CM | POA: Diagnosis not present

## 2023-12-12 DIAGNOSIS — M9903 Segmental and somatic dysfunction of lumbar region: Secondary | ICD-10-CM | POA: Diagnosis not present

## 2023-12-12 DIAGNOSIS — M9905 Segmental and somatic dysfunction of pelvic region: Secondary | ICD-10-CM | POA: Diagnosis not present

## 2023-12-12 DIAGNOSIS — M5416 Radiculopathy, lumbar region: Secondary | ICD-10-CM | POA: Diagnosis not present

## 2023-12-12 DIAGNOSIS — M955 Acquired deformity of pelvis: Secondary | ICD-10-CM | POA: Diagnosis not present

## 2024-01-06 DIAGNOSIS — M9903 Segmental and somatic dysfunction of lumbar region: Secondary | ICD-10-CM | POA: Diagnosis not present

## 2024-01-06 DIAGNOSIS — M9905 Segmental and somatic dysfunction of pelvic region: Secondary | ICD-10-CM | POA: Diagnosis not present

## 2024-01-06 DIAGNOSIS — M5416 Radiculopathy, lumbar region: Secondary | ICD-10-CM | POA: Diagnosis not present

## 2024-01-06 DIAGNOSIS — M955 Acquired deformity of pelvis: Secondary | ICD-10-CM | POA: Diagnosis not present

## 2024-01-15 DIAGNOSIS — Z86018 Personal history of other benign neoplasm: Secondary | ICD-10-CM | POA: Diagnosis not present

## 2024-01-15 DIAGNOSIS — B351 Tinea unguium: Secondary | ICD-10-CM | POA: Diagnosis not present

## 2024-01-15 DIAGNOSIS — L578 Other skin changes due to chronic exposure to nonionizing radiation: Secondary | ICD-10-CM | POA: Diagnosis not present

## 2024-01-28 DIAGNOSIS — M9905 Segmental and somatic dysfunction of pelvic region: Secondary | ICD-10-CM | POA: Diagnosis not present

## 2024-01-28 DIAGNOSIS — M955 Acquired deformity of pelvis: Secondary | ICD-10-CM | POA: Diagnosis not present

## 2024-01-28 DIAGNOSIS — M5416 Radiculopathy, lumbar region: Secondary | ICD-10-CM | POA: Diagnosis not present

## 2024-01-28 DIAGNOSIS — M9903 Segmental and somatic dysfunction of lumbar region: Secondary | ICD-10-CM | POA: Diagnosis not present

## 2024-02-16 DIAGNOSIS — M5416 Radiculopathy, lumbar region: Secondary | ICD-10-CM | POA: Diagnosis not present

## 2024-02-16 DIAGNOSIS — M9903 Segmental and somatic dysfunction of lumbar region: Secondary | ICD-10-CM | POA: Diagnosis not present

## 2024-02-16 DIAGNOSIS — M9905 Segmental and somatic dysfunction of pelvic region: Secondary | ICD-10-CM | POA: Diagnosis not present

## 2024-02-16 DIAGNOSIS — M955 Acquired deformity of pelvis: Secondary | ICD-10-CM | POA: Diagnosis not present

## 2024-03-05 DIAGNOSIS — M5416 Radiculopathy, lumbar region: Secondary | ICD-10-CM | POA: Diagnosis not present

## 2024-03-05 DIAGNOSIS — M955 Acquired deformity of pelvis: Secondary | ICD-10-CM | POA: Diagnosis not present

## 2024-03-05 DIAGNOSIS — M9905 Segmental and somatic dysfunction of pelvic region: Secondary | ICD-10-CM | POA: Diagnosis not present

## 2024-03-05 DIAGNOSIS — M9903 Segmental and somatic dysfunction of lumbar region: Secondary | ICD-10-CM | POA: Diagnosis not present

## 2024-03-31 DIAGNOSIS — M955 Acquired deformity of pelvis: Secondary | ICD-10-CM | POA: Diagnosis not present

## 2024-03-31 DIAGNOSIS — M9903 Segmental and somatic dysfunction of lumbar region: Secondary | ICD-10-CM | POA: Diagnosis not present

## 2024-03-31 DIAGNOSIS — M9905 Segmental and somatic dysfunction of pelvic region: Secondary | ICD-10-CM | POA: Diagnosis not present

## 2024-03-31 DIAGNOSIS — M5416 Radiculopathy, lumbar region: Secondary | ICD-10-CM | POA: Diagnosis not present

## 2024-04-21 DIAGNOSIS — Z01419 Encounter for gynecological examination (general) (routine) without abnormal findings: Secondary | ICD-10-CM | POA: Diagnosis not present

## 2024-04-27 DIAGNOSIS — M5416 Radiculopathy, lumbar region: Secondary | ICD-10-CM | POA: Diagnosis not present

## 2024-04-27 DIAGNOSIS — M9903 Segmental and somatic dysfunction of lumbar region: Secondary | ICD-10-CM | POA: Diagnosis not present

## 2024-04-27 DIAGNOSIS — M955 Acquired deformity of pelvis: Secondary | ICD-10-CM | POA: Diagnosis not present

## 2024-04-27 DIAGNOSIS — M9905 Segmental and somatic dysfunction of pelvic region: Secondary | ICD-10-CM | POA: Diagnosis not present

## 2024-04-27 LAB — HM PAP SMEAR: HPV, high-risk: NEGATIVE

## 2024-05-21 DIAGNOSIS — M5416 Radiculopathy, lumbar region: Secondary | ICD-10-CM | POA: Diagnosis not present

## 2024-05-21 DIAGNOSIS — M9905 Segmental and somatic dysfunction of pelvic region: Secondary | ICD-10-CM | POA: Diagnosis not present

## 2024-05-21 DIAGNOSIS — M955 Acquired deformity of pelvis: Secondary | ICD-10-CM | POA: Diagnosis not present

## 2024-05-21 DIAGNOSIS — M9903 Segmental and somatic dysfunction of lumbar region: Secondary | ICD-10-CM | POA: Diagnosis not present

## 2024-06-11 DIAGNOSIS — M9905 Segmental and somatic dysfunction of pelvic region: Secondary | ICD-10-CM | POA: Diagnosis not present

## 2024-06-11 DIAGNOSIS — M955 Acquired deformity of pelvis: Secondary | ICD-10-CM | POA: Diagnosis not present

## 2024-06-11 DIAGNOSIS — M9903 Segmental and somatic dysfunction of lumbar region: Secondary | ICD-10-CM | POA: Diagnosis not present

## 2024-06-11 DIAGNOSIS — M5416 Radiculopathy, lumbar region: Secondary | ICD-10-CM | POA: Diagnosis not present

## 2024-07-12 ENCOUNTER — Encounter: Payer: Self-pay | Admitting: Student

## 2024-07-12 ENCOUNTER — Ambulatory Visit (INDEPENDENT_AMBULATORY_CARE_PROVIDER_SITE_OTHER): Admitting: Student

## 2024-07-12 VITALS — BP 114/70 | HR 77 | Ht 68.0 in | Wt 148.0 lb

## 2024-07-12 DIAGNOSIS — F411 Generalized anxiety disorder: Secondary | ICD-10-CM | POA: Insufficient documentation

## 2024-07-12 DIAGNOSIS — M9905 Segmental and somatic dysfunction of pelvic region: Secondary | ICD-10-CM | POA: Diagnosis not present

## 2024-07-12 DIAGNOSIS — M79642 Pain in left hand: Secondary | ICD-10-CM | POA: Diagnosis not present

## 2024-07-12 DIAGNOSIS — E782 Mixed hyperlipidemia: Secondary | ICD-10-CM | POA: Diagnosis not present

## 2024-07-12 DIAGNOSIS — M5416 Radiculopathy, lumbar region: Secondary | ICD-10-CM | POA: Diagnosis not present

## 2024-07-12 DIAGNOSIS — M9903 Segmental and somatic dysfunction of lumbar region: Secondary | ICD-10-CM | POA: Diagnosis not present

## 2024-07-12 DIAGNOSIS — M955 Acquired deformity of pelvis: Secondary | ICD-10-CM | POA: Diagnosis not present

## 2024-07-12 NOTE — Progress Notes (Unsigned)
 New Patient Office Visit  Subjective    Patient ID: Cheryl Sanford, female    DOB: 1976/05/04  Age: 48 y.o. MRN: [id number]  CC:  Chief Complaint  Patient presents with   Establish Care    Left joint swelling on left hand/arm, going on and off for the last year    HPI Cheryl Sanford presents to establish care ***  Alsochol not related to alochol  Worse in the last few year   Last spring  Went o trinp in scilicy   Last a couple days month 3-4 time year not as bad in the winter Take ibuprofen with some iprovement No active pain  Took turmeric and pain improved   Left second  PIP   Outpatient Encounter Medications as of 07/12/2024  Medication Sig   escitalopram (LEXAPRO) 10 MG tablet Take 10 mg by mouth daily.   levonorgestrel (MIRENA, 52 MG,) 20 MCG/DAY IUD 1 each by Intrauterine route once.   No facility-administered encounter medications on file as of 07/12/2024.    Past Medical History:  Diagnosis Date   Anxiety     Past Surgical History:  Procedure Laterality Date   COLONOSCOPY WITH PROPOFOL  N/A 09/10/2021   Procedure: COLONOSCOPY WITH PROPOFOL ;  Surgeon: Unk Corinn Skiff, MD;  Location: [facility] ENDOSCOPY;  Service: Gastroenterology;  Laterality: N/A;   NO PAST SURGERIES      Family History  Problem Relation Age of Onset   Fibromyalgia Mother    Breast cancer Mother 32   Diabetes Father    Hyperlipidemia Father     Social History   Socioeconomic History   Marital status: Married    Spouse name: Not on file   Number of children: Not on file   Years of education: Not on file   Highest education level: Bachelor's degree (e.g., BA, AB, BS)  Occupational History   Not on file  Tobacco Use   Smoking status: Never   Smokeless tobacco: Never  Vaping Use   Vaping status: Never Used  Substance and Sexual Activity   Alcohol use: Yes    Comment: social   Drug use: Never   Sexual activity: Not on file  Other Topics Concern   Not on file  Social  History Narrative   Not on file   Social Drivers of Health   Financial Resource Strain: Low Risk  (07/11/2024)   Overall Financial Resource Strain (CARDIA)    Difficulty of Paying Living Expenses: Not hard at all  Food Insecurity: No Food Insecurity (07/11/2024)   Hunger Vital Sign    Worried About Running Out of Food in the Last Year: Never true    Ran Out of Food in the Last Year: Never true  Transportation Needs: No Transportation Needs (07/11/2024)   PRAPARE - Administrator, Civil Service (Medical): No    Lack of Transportation (Non-Medical): No  Physical Activity: Insufficiently Active (07/11/2024)   Exercise Vital Sign    Days of Exercise per Week: 3 days    Minutes of Exercise per Session: 40 min  Stress: No Stress Concern Present (07/11/2024)   [employer] of Occupational Health - Occupational Stress Questionnaire    Feeling of Stress: Only a little  Social Connections: Socially Integrated (07/11/2024)   Social Connection and Isolation Panel    Frequency of Communication with Friends and Family: Three times a week    Frequency of Social Gatherings with Friends and Family: Once a week    Attends Religious Services: More  than 4 times per year    Active Member of Clubs or Organizations: Yes    Attends Banker Meetings: More than 4 times per year    Marital Status: Married  Catering manager Violence: Not on file    ROS Refer to HPI    Objective   BP 114/70   Pulse 77   Ht 5' 8 (1.727 m)   Wt 148 lb (67.1 kg)   SpO2 99%   BMI 22.50 kg/m   Physical Exam     07/12/2024    3:40 PM  Depression screen PHQ 2/9  Decreased Interest 0  Down, Depressed, Hopeless 0  PHQ - 2 Score 0  Altered sleeping 1  Tired, decreased energy 1  Change in appetite 0  Feeling bad or failure about yourself  0  Trouble concentrating 1  Moving slowly or fidgety/restless 0  Suicidal thoughts 0  PHQ-9 Score 3  Difficult doing work/chores Not difficult at all       07/12/2024    3:40 PM  GAD 7 : Generalized Anxiety Score  Nervous, Anxious, on Edge 1  Control/stop worrying 0  Worry too much - different things 1  Trouble relaxing 1  Restless 1  Easily annoyed or irritable 1  Afraid - awful might happen 0  Total GAD 7 Score 5  Anxiety Difficulty Not difficult at all    {Labs (Optional):23779}    Assessment & Plan:  There are no diagnoses linked to this encounter.  No follow-ups on file.   Harlene Saddler, MD

## 2024-07-12 NOTE — Assessment & Plan Note (Signed)
 Work is the main stress factor chill en and family  Lexapro yoga

## 2024-07-12 NOTE — Patient Instructions (Signed)
 Below you will find the link to the [facility] community research project I mentioned through [employer]. This is a free genetic screening opportunity specifically looking for three conditions:  Hereditary breast and ovarian cancer syndrome Lynch syndrome (increased risks for colorectal, endometrial and other cancers) Familial hypercholesterolemia (very high cholesterol)  We focus on these three conditions because they can occur in the general population, and if you discover you have one of these conditions, there are specific actions you can take to reduce your risk.  If you'd like to learn more, I recommend visiting this link: [email or web address]

## 2024-07-13 DIAGNOSIS — E785 Hyperlipidemia, unspecified: Secondary | ICD-10-CM | POA: Insufficient documentation

## 2024-07-13 DIAGNOSIS — M79642 Pain in left hand: Secondary | ICD-10-CM | POA: Insufficient documentation

## 2024-07-13 NOTE — Assessment & Plan Note (Signed)
 LDL elevated to 129 In 09/12/2023.  Not currently statin.  ASCVD risk is low.  Currently working on diet and lifestyle modifications. Repeat at next visit.

## 2024-07-13 NOTE — Assessment & Plan Note (Signed)
 Suspect OA, pain is not function limiting, treat conservatively with topical NSAIDs or ibuprofen as needed. RA is a possibility given hx of MCP and PIP joints are affected although less lively given asymmetry of symptoms. Will have her follow up when having a flare for further evaluation.

## 2024-08-09 DIAGNOSIS — M5416 Radiculopathy, lumbar region: Secondary | ICD-10-CM | POA: Diagnosis not present

## 2024-08-09 DIAGNOSIS — M955 Acquired deformity of pelvis: Secondary | ICD-10-CM | POA: Diagnosis not present

## 2024-08-09 DIAGNOSIS — M9905 Segmental and somatic dysfunction of pelvic region: Secondary | ICD-10-CM | POA: Diagnosis not present

## 2024-08-09 DIAGNOSIS — M9903 Segmental and somatic dysfunction of lumbar region: Secondary | ICD-10-CM | POA: Diagnosis not present

## 2024-09-06 DIAGNOSIS — M5416 Radiculopathy, lumbar region: Secondary | ICD-10-CM | POA: Diagnosis not present

## 2024-09-06 DIAGNOSIS — M955 Acquired deformity of pelvis: Secondary | ICD-10-CM | POA: Diagnosis not present

## 2024-09-06 DIAGNOSIS — M9903 Segmental and somatic dysfunction of lumbar region: Secondary | ICD-10-CM | POA: Diagnosis not present

## 2024-09-06 DIAGNOSIS — M9905 Segmental and somatic dysfunction of pelvic region: Secondary | ICD-10-CM | POA: Diagnosis not present

## 2024-09-07 ENCOUNTER — Other Ambulatory Visit: Payer: Self-pay | Admitting: Medical Genetics

## 2024-09-10 ENCOUNTER — Other Ambulatory Visit
Admission: RE | Admit: 2024-09-10 | Discharge: 2024-09-10 | Disposition: A | Discharge status: Home / Self Care | Payer: Self-pay | Source: Ambulatory Visit | Attending: Medical Genetics | Admitting: Medical Genetics

## 2024-09-13 ENCOUNTER — Ambulatory Visit (INDEPENDENT_AMBULATORY_CARE_PROVIDER_SITE_OTHER): Admitting: Student

## 2024-09-13 ENCOUNTER — Encounter: Payer: Self-pay | Admitting: Student

## 2024-09-13 VITALS — BP 106/74 | HR 72 | Ht 68.0 in | Wt 149.0 lb

## 2024-09-13 DIAGNOSIS — Z0000 Encounter for general adult medical examination without abnormal findings: Secondary | ICD-10-CM | POA: Diagnosis not present

## 2024-09-13 DIAGNOSIS — Z1231 Encounter for screening mammogram for malignant neoplasm of breast: Secondary | ICD-10-CM | POA: Diagnosis not present

## 2024-09-13 DIAGNOSIS — Z23 Encounter for immunization: Secondary | ICD-10-CM

## 2024-09-13 DIAGNOSIS — F411 Generalized anxiety disorder: Secondary | ICD-10-CM | POA: Diagnosis not present

## 2024-09-13 NOTE — Patient Instructions (Signed)
 Please call to schedule your mammogram [phone]

## 2024-09-13 NOTE — Assessment & Plan Note (Signed)
 GAD7 is 4 today. Notices worsening irritability when she forgets taking medication.

## 2024-09-13 NOTE — Progress Notes (Unsigned)
 Complete physical exam  Patient: Cheryl Sanford   DOB: [birth year]   48 y.o. Female  MRN: [id number]  Subjective:    Chief Complaint  Patient presents with   Annual Exam    Cheryl Sanford is a 48 y.o. female who presents today for a complete physical exam. She reports consuming a general diet. Gym/ health club routine includes yoga. She generally feels well. She reports sleeping well. She does not have additional problems to discuss today.    Dental: No current dental problems and Receives regular dental care  Patient Active Problem List   Diagnosis Date Noted   Annual physical exam 09/14/2024   Left hand pain 07/13/2024   HLD (hyperlipidemia) 07/13/2024   GAD (generalized anxiety disorder) 07/12/2024   Polyp of cecum       Patient Care Team: Lemon Raisin, MD as PCP - General (Internal Medicine)   Outpatient Medications Prior to Visit  Medication Sig   escitalopram (LEXAPRO) 10 MG tablet Take 10 mg by mouth daily.   levonorgestrel (MIRENA, 52 MG,) 20 MCG/DAY IUD 1 each by Intrauterine route once.   No facility-administered medications prior to visit.    ROS Refer to HPI     Objective:    BP 106/74   Pulse 72   Ht 5' 8 (1.727 m)   Wt 149 lb (67.6 kg)   SpO2 97%   BMI 22.66 kg/m  BP Readings from Last 3 Encounters:  09/13/24 106/74  07/12/24 114/70  09/10/21 124/83    Physical Exam Constitutional:      Appearance: Normal appearance.  HENT:     Right Ear: Tympanic membrane normal.     Left Ear: Tympanic membrane normal.     Nose: Nose normal. No congestion.     Mouth/Throat:     Mouth: Mucous membranes are moist.     Pharynx: Oropharynx is clear.  Neck:     Comments: No masses  Cardiovascular:     Rate and Rhythm: Normal rate and regular rhythm.  Pulmonary:     Effort: Pulmonary effort is normal.     Breath sounds: No rhonchi or rales.  Abdominal:     General: Abdomen is flat. Bowel sounds are normal. There is no distension.     Palpations:  Abdomen is soft.     Tenderness: There is no abdominal tenderness.  Musculoskeletal:        General: Normal range of motion.     Cervical back: Neck supple. No tenderness.     Right lower leg: No edema.     Left lower leg: No edema.  Skin:    General: Skin is warm and dry.     Capillary Refill: Capillary refill takes less than 2 seconds.  Neurological:     General: No focal deficit present.     Mental Status: She is alert and oriented to person, place, and time.  Psychiatric:        Mood and Affect: Mood normal.        Behavior: Behavior normal.         09/13/2024    2:04 PM 07/12/2024    3:40 PM  Depression screen PHQ 2/9  Decreased Interest 0 0  Down, Depressed, Hopeless 0 0  PHQ - 2 Score 0 0  Altered sleeping 1 1  Tired, decreased energy 1 1  Change in appetite 1 0  Feeling bad or failure about yourself  0 0  Trouble concentrating 1 1  Moving slowly or  fidgety/restless 0 0  Suicidal thoughts 0 0  PHQ-9 Score 4 3  Difficult doing work/chores Not difficult at all Not difficult at all      09/13/2024    2:04 PM 07/12/2024    3:40 PM  GAD 7 : Generalized Anxiety Score  Nervous, Anxious, on Edge 1 1  Control/stop worrying 0 0  Worry too much - different things 1 1  Trouble relaxing 1 1  Restless 0 1  Easily annoyed or irritable 1 1  Afraid - awful might happen 0 0  Total GAD 7 Score 4 5  Anxiety Difficulty Not difficult at all Not difficult at all      Assessment & Plan:    Routine Health Maintenance and Physical Exam  Health Maintenance  Topic Date Due   HIV Screening  Never done   Pap with HPV screening  Never done   Hepatitis B Vaccine (3 of 3 - 19+ 3-dose series) 12/20/2021   Breast Cancer Screening  04/26/2024   COVID-19 Vaccine (5 - 2025-26 season) 09/29/2025*   DTaP/Tdap/Td vaccine (4 - Td or Tdap) 06/23/2031   Colon Cancer Screening  09/11/2031   Flu Shot  Completed   Hepatitis C Screening  Completed   Pneumococcal Vaccine  Aged Out   HPV  Vaccine  Aged Out   Meningitis B Vaccine  Aged Out  *Topic was postponed. The date shown is not the original due date.    Discussed health benefits of physical activity, and encouraged her to engage in regular exercise appropriate for her age and condition.  Annual physical exam Assessment & Plan: Labs ordered today  Last mammogram fall 2024 normal, mammogram fhx includes mother dx in 40s Flu shot today PAP with GYN in April 2025 request records   Orders: -     Lipid panel -     CBC with Differential/Platelet -     TSH + free T4 -     Hemoglobin A1c -     Comprehensive metabolic panel with GFR  Encounter for immunization -     Flu vaccine trivalent PF, 6mos and older(Flulaval,Afluria,Fluarix,Fluzone)  GAD (generalized anxiety disorder) Assessment & Plan: GAD7 is 4 today. Notices worsening irritability when she forgets taking medication but otherwise doing well. Continue lexapro 10 mg daily    Screening mammogram for breast cancer -     3D Screening Mammogram, Left and Right    Return in about 1 year (around 09/13/2025) for physical.     Harlene Saddler, MD

## 2024-09-14 ENCOUNTER — Ambulatory Visit: Payer: Self-pay | Admitting: Student

## 2024-09-14 DIAGNOSIS — Z0000 Encounter for general adult medical examination without abnormal findings: Secondary | ICD-10-CM | POA: Insufficient documentation

## 2024-09-14 LAB — COMPREHENSIVE METABOLIC PANEL WITH GFR
ALT: 14 IU/L (ref 0–32)
AST: 18 IU/L (ref 0–40)
Albumin: 4.7 g/dL (ref 3.9–4.9)
Alkaline Phosphatase: 53 IU/L (ref 41–116)
BUN/Creatinine Ratio: 14 (ref 9–23)
BUN: 11 mg/dL (ref 6–24)
Bilirubin Total: 0.4 mg/dL (ref 0.0–1.2)
CO2: 25 mmol/L (ref 20–29)
Calcium: 9.7 mg/dL (ref 8.7–10.2)
Chloride: 102 mmol/L (ref 96–106)
Creatinine, Ser: 0.76 mg/dL (ref 0.57–1.00)
Globulin, Total: 2.4 g/dL (ref 1.5–4.5)
Glucose: 80 mg/dL (ref 70–99)
Potassium: 4.5 mmol/L (ref 3.5–5.2)
Sodium: 141 mmol/L (ref 134–144)
Total Protein: 7.1 g/dL (ref 6.0–8.5)
eGFR: 97 mL/min/1.73 (ref >59)

## 2024-09-14 LAB — CBC WITH DIFFERENTIAL/PLATELET
Basophils Absolute: 0 x10E3/uL (ref 0.0–0.2)
Basos: 1 %
EOS (ABSOLUTE): 0.1 x10E3/uL (ref 0.0–0.4)
Eos: 1 %
Hematocrit: 40 % (ref 34.0–46.6)
Hemoglobin: 13.5 g/dL (ref 11.1–15.9)
Immature Grans (Abs): 0 x10E3/uL (ref 0.0–0.1)
Immature Granulocytes: 0 %
Lymphocytes Absolute: 1.3 x10E3/uL (ref 0.7–3.1)
Lymphs: 30 %
MCH: 32.7 pg (ref 26.6–33.0)
MCHC: 33.8 g/dL (ref 31.5–35.7)
MCV: 97 fL (ref 79–97)
Monocytes Absolute: 0.3 x10E3/uL (ref 0.1–0.9)
Monocytes: 8 %
Neutrophils Absolute: 2.7 x10E3/uL (ref 1.4–7.0)
Neutrophils: 60 %
Platelets: 230 x10E3/uL (ref 150–450)
RBC: 4.13 x10E6/uL (ref 3.77–5.28)
RDW: 11.8 % (ref 11.7–15.4)
WBC: 4.5 x10E3/uL (ref 3.4–10.8)

## 2024-09-14 LAB — HEMOGLOBIN A1C
Est. average glucose Bld gHb Est-mCnc: 103 mg/dL
Hgb A1c MFr Bld: 5.2 % (ref 4.8–5.6)

## 2024-09-14 LAB — LIPID PANEL
Chol/HDL Ratio: 3.2 ratio (ref 0.0–4.4)
Cholesterol, Total: 211 mg/dL — ABNORMAL HIGH (ref 100–199)
HDL: 65 mg/dL (ref >39)
LDL Chol Calc (NIH): 126 mg/dL — ABNORMAL HIGH (ref 0–99)
Triglycerides: 113 mg/dL (ref 0–149)
VLDL Cholesterol Cal: 20 mg/dL (ref 5–40)

## 2024-09-14 LAB — TSH+FREE T4
Free T4: 1.02 ng/dL (ref 0.82–1.77)
TSH: 1.05 u[IU]/mL (ref 0.450–4.500)

## 2024-09-14 NOTE — Assessment & Plan Note (Addendum)
 Labs ordered today  Last mammogram fall 2024 normal, mammogram fhx includes mother dx in 34s Flu shot today PAP with GYN in April 2025 request records

## 2024-09-19 LAB — GENECONNECT MOLECULAR SCREEN: Genetic Analysis Overall Interpretation: NEGATIVE

## 2024-09-20 ENCOUNTER — Encounter: Payer: Self-pay | Admitting: Student

## 2024-10-06 DIAGNOSIS — M5416 Radiculopathy, lumbar region: Secondary | ICD-10-CM | POA: Diagnosis not present

## 2024-10-06 DIAGNOSIS — M955 Acquired deformity of pelvis: Secondary | ICD-10-CM | POA: Diagnosis not present

## 2024-10-06 DIAGNOSIS — M9903 Segmental and somatic dysfunction of lumbar region: Secondary | ICD-10-CM | POA: Diagnosis not present

## 2024-10-06 DIAGNOSIS — M9905 Segmental and somatic dysfunction of pelvic region: Secondary | ICD-10-CM | POA: Diagnosis not present

## 2024-11-05 DIAGNOSIS — M5416 Radiculopathy, lumbar region: Secondary | ICD-10-CM | POA: Diagnosis not present

## 2024-11-05 DIAGNOSIS — M9903 Segmental and somatic dysfunction of lumbar region: Secondary | ICD-10-CM | POA: Diagnosis not present

## 2024-11-05 DIAGNOSIS — M9905 Segmental and somatic dysfunction of pelvic region: Secondary | ICD-10-CM | POA: Diagnosis not present

## 2024-11-05 DIAGNOSIS — M955 Acquired deformity of pelvis: Secondary | ICD-10-CM | POA: Diagnosis not present

## 2024-11-08 ENCOUNTER — Ambulatory Visit
Admission: RE | Admit: 2024-11-08 | Discharge: 2024-11-08 | Disposition: A | Discharge status: Home / Self Care | Source: Ambulatory Visit | Attending: Student | Admitting: Student

## 2024-11-08 DIAGNOSIS — Z1231 Encounter for screening mammogram for malignant neoplasm of breast: Secondary | ICD-10-CM | POA: Insufficient documentation

## 2024-11-16 ENCOUNTER — Other Ambulatory Visit: Payer: Self-pay | Admitting: *Deleted

## 2024-11-16 ENCOUNTER — Inpatient Hospital Stay
Admission: RE | Admit: 2024-11-16 | Discharge: 2024-11-16 | Disposition: A | Discharge status: Home / Self Care | Payer: Self-pay | Source: Ambulatory Visit | Attending: Student | Admitting: Student

## 2024-11-16 DIAGNOSIS — Z1231 Encounter for screening mammogram for malignant neoplasm of breast: Secondary | ICD-10-CM

## 2024-11-22 ENCOUNTER — Other Ambulatory Visit: Payer: Self-pay | Admitting: Student

## 2024-11-22 DIAGNOSIS — R928 Other abnormal and inconclusive findings on diagnostic imaging of breast: Secondary | ICD-10-CM

## 2024-11-26 ENCOUNTER — Inpatient Hospital Stay: Admission: RE | Admit: 2024-11-26 | Discharge: 2024-11-26 | Attending: Student

## 2024-11-26 DIAGNOSIS — R928 Other abnormal and inconclusive findings on diagnostic imaging of breast: Secondary | ICD-10-CM

## 2024-11-26 DIAGNOSIS — R92343 Mammographic extreme density, bilateral breasts: Secondary | ICD-10-CM | POA: Diagnosis not present

## 2024-12-03 DIAGNOSIS — M9905 Segmental and somatic dysfunction of pelvic region: Secondary | ICD-10-CM | POA: Diagnosis not present

## 2024-12-03 DIAGNOSIS — M5416 Radiculopathy, lumbar region: Secondary | ICD-10-CM | POA: Diagnosis not present

## 2024-12-03 DIAGNOSIS — M9903 Segmental and somatic dysfunction of lumbar region: Secondary | ICD-10-CM | POA: Diagnosis not present

## 2024-12-03 DIAGNOSIS — M955 Acquired deformity of pelvis: Secondary | ICD-10-CM | POA: Diagnosis not present

## 2025-09-16 ENCOUNTER — Encounter: Admitting: Student
